# Patient Record
Sex: Female | Born: 1985 | Race: White | Hispanic: No | Smoking: Never smoker
Health system: Southern US, Community
[De-identification: ages and names within clinical notes are randomized; demographics above are authoritative.]

## PROBLEM LIST (undated history)

## (undated) DIAGNOSIS — F419 Anxiety disorder, unspecified: Secondary | ICD-10-CM

## (undated) DIAGNOSIS — F410 Panic disorder [episodic paroxysmal anxiety] without agoraphobia: Secondary | ICD-10-CM

---

## 2005-02-15 ENCOUNTER — Emergency Department: Payer: Self-pay | Admitting: Emergency Medicine

## 2005-06-15 ENCOUNTER — Emergency Department: Payer: Self-pay | Admitting: Emergency Medicine

## 2006-03-02 ENCOUNTER — Emergency Department: Payer: Self-pay | Admitting: Unknown Physician Specialty

## 2007-03-31 ENCOUNTER — Encounter: Payer: Self-pay | Admitting: Maternal & Fetal Medicine

## 2007-05-06 ENCOUNTER — Observation Stay: Payer: Self-pay | Admitting: Unknown Physician Specialty

## 2007-05-08 ENCOUNTER — Observation Stay: Payer: Self-pay

## 2007-06-03 ENCOUNTER — Observation Stay: Payer: Self-pay | Admitting: Obstetrics & Gynecology

## 2007-06-13 ENCOUNTER — Inpatient Hospital Stay: Payer: Self-pay

## 2007-08-22 ENCOUNTER — Inpatient Hospital Stay: Payer: Self-pay

## 2008-04-07 ENCOUNTER — Emergency Department: Payer: Self-pay | Admitting: Internal Medicine

## 2008-07-25 ENCOUNTER — Emergency Department: Payer: Self-pay | Admitting: Internal Medicine

## 2011-01-12 DIAGNOSIS — E785 Hyperlipidemia, unspecified: Secondary | ICD-10-CM | POA: Insufficient documentation

## 2011-06-22 ENCOUNTER — Ambulatory Visit: Payer: Self-pay | Admitting: Family Medicine

## 2011-07-16 ENCOUNTER — Ambulatory Visit: Payer: Self-pay | Admitting: Urology

## 2011-07-16 LAB — PREGNANCY, URINE: Pregnancy Test, Urine: NEGATIVE m[IU]/mL

## 2012-05-25 ENCOUNTER — Ambulatory Visit: Payer: Self-pay | Admitting: Obstetrics & Gynecology

## 2012-05-27 DIAGNOSIS — Z331 Pregnant state, incidental: Secondary | ICD-10-CM | POA: Insufficient documentation

## 2012-05-27 DIAGNOSIS — R109 Unspecified abdominal pain: Secondary | ICD-10-CM | POA: Insufficient documentation

## 2012-05-27 DIAGNOSIS — N390 Urinary tract infection, site not specified: Secondary | ICD-10-CM | POA: Insufficient documentation

## 2012-05-27 DIAGNOSIS — N200 Calculus of kidney: Secondary | ICD-10-CM | POA: Insufficient documentation

## 2012-11-05 ENCOUNTER — Inpatient Hospital Stay: Payer: Self-pay | Admitting: Obstetrics and Gynecology

## 2012-11-05 LAB — CBC WITH DIFFERENTIAL/PLATELET
Basophil #: 0 10*3/uL (ref 0.0–0.1)
Basophil %: 0.4 %
Eosinophil #: 0.1 10*3/uL (ref 0.0–0.7)
HCT: 31.4 % — ABNORMAL LOW (ref 35.0–47.0)
HGB: 10.5 g/dL — ABNORMAL LOW (ref 12.0–16.0)
Lymphocyte %: 15.9 %
MCHC: 33.4 g/dL (ref 32.0–36.0)
MCV: 78 fL — ABNORMAL LOW (ref 80–100)
Monocyte #: 0.7 x10 3/mm (ref 0.2–0.9)
Neutrophil #: 7.8 10*3/uL — ABNORMAL HIGH (ref 1.4–6.5)
Neutrophil %: 75.7 %
Platelet: 134 10*3/uL — ABNORMAL LOW (ref 150–440)
RBC: 4.05 10*6/uL (ref 3.80–5.20)

## 2013-03-30 ENCOUNTER — Ambulatory Visit: Payer: Self-pay | Admitting: Urology

## 2013-04-19 ENCOUNTER — Ambulatory Visit: Payer: Self-pay | Admitting: Urology

## 2013-04-19 LAB — HCG, QUANTITATIVE, PREGNANCY
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
## 2015-01-04 ENCOUNTER — Emergency Department: Payer: No Typology Code available for payment source

## 2015-01-04 ENCOUNTER — Emergency Department
Admission: EM | Admit: 2015-01-04 | Discharge: 2015-01-04 | Disposition: A | Discharge status: Home / Self Care | Payer: No Typology Code available for payment source | Attending: Emergency Medicine | Admitting: Emergency Medicine

## 2015-01-04 ENCOUNTER — Encounter: Payer: Self-pay | Admitting: Emergency Medicine

## 2015-01-04 DIAGNOSIS — Y998 Other external cause status: Secondary | ICD-10-CM | POA: Insufficient documentation

## 2015-01-04 DIAGNOSIS — G44319 Acute post-traumatic headache, not intractable: Secondary | ICD-10-CM

## 2015-01-04 DIAGNOSIS — Z791 Long term (current) use of non-steroidal anti-inflammatories (NSAID): Secondary | ICD-10-CM | POA: Insufficient documentation

## 2015-01-04 DIAGNOSIS — S20212A Contusion of left front wall of thorax, initial encounter: Secondary | ICD-10-CM | POA: Insufficient documentation

## 2015-01-04 DIAGNOSIS — Y92410 Unspecified street and highway as the place of occurrence of the external cause: Secondary | ICD-10-CM | POA: Diagnosis not present

## 2015-01-04 DIAGNOSIS — Y9389 Activity, other specified: Secondary | ICD-10-CM | POA: Insufficient documentation

## 2015-01-04 DIAGNOSIS — M62838 Other muscle spasm: Secondary | ICD-10-CM | POA: Diagnosis not present

## 2015-01-04 DIAGNOSIS — S0990XA Unspecified injury of head, initial encounter: Secondary | ICD-10-CM | POA: Diagnosis present

## 2015-01-04 DIAGNOSIS — S0003XA Contusion of scalp, initial encounter: Secondary | ICD-10-CM | POA: Diagnosis not present

## 2015-01-04 HISTORY — DX: Anxiety disorder, unspecified: F41.9

## 2015-01-04 HISTORY — DX: Panic disorder (episodic paroxysmal anxiety): F41.0

## 2015-01-04 MED ORDER — HYDROCODONE-ACETAMINOPHEN 5-325 MG PO TABS: 1.0000 | ORAL_TABLET | ORAL | Status: DC | PRN

## 2015-01-04 MED ORDER — MELOXICAM 15 MG PO TABS: 15.0000 mg | ORAL_TABLET | Freq: Every day | ORAL | Status: DC

## 2015-01-04 MED ORDER — DIAZEPAM 2 MG PO TABS: 2.0000 mg | ORAL_TABLET | Freq: Three times a day (TID) | ORAL | Status: DC | PRN

## 2015-01-04 MED ORDER — HYDROCODONE-ACETAMINOPHEN 5-325 MG PO TABS
1.0000 | ORAL_TABLET | Freq: Once | ORAL | Status: AC
  Administered 2015-01-04: 1 via ORAL
  Filled 2015-01-04: qty 1
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
## 2016-12-09 ENCOUNTER — Other Ambulatory Visit: Payer: Self-pay | Admitting: Family Medicine

## 2016-12-09 DIAGNOSIS — N6310 Unspecified lump in the right breast, unspecified quadrant: Secondary | ICD-10-CM

## 2016-12-21 ENCOUNTER — Ambulatory Visit
Admission: RE | Admit: 2016-12-21 | Discharge: 2016-12-21 | Disposition: A | Discharge status: Home / Self Care | Payer: BLUE CROSS/BLUE SHIELD | Source: Ambulatory Visit | Attending: Family Medicine | Admitting: Family Medicine

## 2016-12-21 DIAGNOSIS — N6311 Unspecified lump in the right breast, upper outer quadrant: Secondary | ICD-10-CM | POA: Insufficient documentation

## 2016-12-21 DIAGNOSIS — N6310 Unspecified lump in the right breast, unspecified quadrant: Secondary | ICD-10-CM
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
## 2021-05-12 ENCOUNTER — Encounter: Payer: Self-pay | Admitting: *Deleted

## 2022-05-07 DIAGNOSIS — F331 Major depressive disorder, recurrent, moderate: Secondary | ICD-10-CM | POA: Diagnosis not present

## 2022-05-07 DIAGNOSIS — F4321 Adjustment disorder with depressed mood: Secondary | ICD-10-CM | POA: Diagnosis not present

## 2022-05-07 DIAGNOSIS — Z1330 Encounter for screening examination for mental health and behavioral disorders, unspecified: Secondary | ICD-10-CM | POA: Diagnosis not present

## 2022-05-28 DIAGNOSIS — Z1330 Encounter for screening examination for mental health and behavioral disorders, unspecified: Secondary | ICD-10-CM | POA: Diagnosis not present

## 2022-05-28 DIAGNOSIS — F331 Major depressive disorder, recurrent, moderate: Secondary | ICD-10-CM | POA: Diagnosis not present

## 2022-05-28 DIAGNOSIS — F4321 Adjustment disorder with depressed mood: Secondary | ICD-10-CM | POA: Diagnosis not present

## 2022-05-28 DIAGNOSIS — Z1331 Encounter for screening for depression: Secondary | ICD-10-CM | POA: Diagnosis not present

## 2022-05-28 DIAGNOSIS — R21 Rash and other nonspecific skin eruption: Secondary | ICD-10-CM | POA: Diagnosis not present

## 2022-06-02 DIAGNOSIS — F4321 Adjustment disorder with depressed mood: Secondary | ICD-10-CM | POA: Diagnosis not present

## 2022-06-02 DIAGNOSIS — F4311 Post-traumatic stress disorder, acute: Secondary | ICD-10-CM | POA: Diagnosis not present

## 2023-03-30 ENCOUNTER — Ambulatory Visit (INDEPENDENT_AMBULATORY_CARE_PROVIDER_SITE_OTHER)

## 2023-03-30 ENCOUNTER — Ambulatory Visit
Admission: EM | Admit: 2023-03-30 | Discharge: 2023-03-30 | Disposition: A | Discharge status: Home / Self Care | Attending: Emergency Medicine | Admitting: Emergency Medicine

## 2023-03-30 DIAGNOSIS — F419 Anxiety disorder, unspecified: Secondary | ICD-10-CM | POA: Insufficient documentation

## 2023-03-30 DIAGNOSIS — M546 Pain in thoracic spine: Secondary | ICD-10-CM

## 2023-03-30 DIAGNOSIS — M545 Low back pain, unspecified: Secondary | ICD-10-CM

## 2023-03-30 DIAGNOSIS — S39012A Strain of muscle, fascia and tendon of lower back, initial encounter: Secondary | ICD-10-CM

## 2023-03-30 DIAGNOSIS — F32A Depression, unspecified: Secondary | ICD-10-CM | POA: Insufficient documentation

## 2023-03-30 MED ORDER — NAPROXEN 500 MG PO TABS: 500.0000 mg | ORAL_TABLET | Freq: Two times a day (BID) | ORAL | 0 refills | Status: AC

## 2023-03-30 MED ORDER — LIDOCAINE 5 % EX PTCH: 1.0000 | MEDICATED_PATCH | CUTANEOUS | 0 refills | Status: AC

## 2023-03-30 MED ORDER — METHYLPREDNISOLONE 4 MG PO TBPK: ORAL_TABLET | Freq: Every day | ORAL | 0 refills | Status: AC

## 2023-03-30 MED ORDER — HYDROCODONE-ACETAMINOPHEN 5-325 MG PO TABS: 1.0000 | ORAL_TABLET | Freq: Four times a day (QID) | ORAL | 0 refills | Status: AC | PRN

## 2023-03-30 MED ORDER — DEXAMETHASONE SODIUM PHOSPHATE 10 MG/ML IJ SOLN
10.0000 mg | Freq: Once | INTRAMUSCULAR | Status: AC
  Administered 2023-03-30: 10 mg via INTRAMUSCULAR

## 2023-03-30 MED ORDER — TIZANIDINE HCL 4 MG PO TABS: 4.0000 mg | ORAL_TABLET | Freq: Three times a day (TID) | ORAL | 0 refills | Status: AC | PRN

## 2023-03-30 NOTE — ED Triage Notes (Signed)
 Pt c/o back pain x2 days. States she was picking up elderly dog & as she was standing up she heard a pop. Has tried husbands left over oxy,flexeril & lidocaine patches w/o relief.

## 2023-03-30 NOTE — Discharge Instructions (Signed)
 You have some disc space narrowing in between TTE 12 and L1, which is primarily where your pain is.  This appears to be chronic per radiology.  The rest of your lumbar films appear normal to me.  We will contact you if the formal radiology over read was enough from mine and we need to change management.  Take the Naprosyn with a Tylenol containing product twice a day.  Either take the Naprosyn with 1000 mg of Tylenol or the Naprosyn with 1-2 Norco for severe pain.  You may take an additional 1000 mg of Tylenol or 1-2 Norco 1 more time a day.  Do not take the Klonopin if you are taking the Norco.  This can cause respiratory depression and death.  Zanaflex is an excellent muscle relaxant, and the Medrol Dosepak should help significantly.  Please follow-up with [facility] ASAP.  Go to the ER for numbness between your thighs, if you accidentally urinate or defecate on yourself, urinary retention, fevers above 100.4, or for other concerns

## 2023-03-30 NOTE — ED Provider Notes (Signed)
 HPI  SUBJECTIVE:  Diamond Ortiz is a 38 y.o. female who presents with the acute onset of severe Lower thoracic, upper lumbar pain after lifting a 60 pound dog 2 days ago.  States that she felt a pop, followed by spasm and burning pain.  She states that the pain is sharp, burning, constant, worse with movement.  She states that radiates down to her hips, it does not go into her legs or around into her chest.  No direct trauma to the area, numbness/tingling/weakness in her legs, saddle anesthesia, urinary fecal incontinence, urinary retention, fevers.  She has been using heating pad, her late husband's leftover OxyContin, lidocaine patches, Flexeril and has been taking ibuprofen 800 mg combined with 1000 mg of Tylenol every 4-6 hours with partial improvement in her symptoms.  Symptoms are worse with any movement, standing upright, turning over in bed.  She denies urinary symptoms.  No previous history of back injury, osteoporosis, diabetes, chronic kidney disease. She has a past medical history of recurrent E. coli UTIs, nephrolithiasis, hyperlipidemia.  LMP: She has an IUD.  Denies the possibility of being pregnant.  PCP: [facility].  Past Medical History:  Diagnosis Date   Anxiety    Panic attack depression    History reviewed. No pertinent surgical history.  Family History  Problem Relation Age of Onset   Breast cancer Paternal Aunt     Social History   Tobacco Use   Smoking status: Never  Substance Use Topics   Alcohol use: No    No current facility-administered medications for this encounter.  Current Outpatient Medications:    buPROPion (WELLBUTRIN XL) 300 MG 24 hr tablet, Take 300 mg by mouth daily., Disp: , Rfl:    clonazePAM (KLONOPIN) 1 MG tablet, Take 0.5-1 tablets (0.5-1 mg total) by mouth 2 (two) times daily as needed for Anxiety for up to 90 days, Disp: , Rfl:    desvenlafaxine (PRISTIQ) 100 MG 24 hr tablet, Take 100 mg by mouth daily., Disp: , Rfl:     HYDROcodone-acetaminophen (NORCO/VICODIN) 5-325 MG tablet, Take 1-2 tablets by mouth every 6 (six) hours as needed for moderate pain (pain score 4-6) or severe pain (pain score 7-10)., Disp: 12 tablet, Rfl: 0   levonorgestrel (LILETTA) 20.1 MCG/DAY IUD IUD, Insert 1 each into the uterus once, Disp: , Rfl:    lidocaine (LIDODERM) 5 %, Place 1 patch onto the skin daily. Remove & Discard patch within 12 hours or as directed by MD, Disp: 6 patch, Rfl: 0   methenamine (HIPREX) 1 g tablet, Take 1 g by mouth 2 (two) times daily., Disp: , Rfl:    methylPREDNISolone (MEDROL DOSEPAK) 4 MG TBPK tablet, Take by mouth daily. Follow package instructions, Disp: 21 tablet, Rfl: 0   naproxen (NAPROSYN) 500 MG tablet, Take 1 tablet (500 mg total) by mouth 2 (two) times daily., Disp: 20 tablet, Rfl: 0   ondansetron (ZOFRAN-ODT) 4 MG disintegrating tablet, Take 1 tablet (4 mg total) by mouth every 8 (eight) hours as needed for Nausea, Disp: , Rfl:    tiZANidine (ZANAFLEX) 4 MG tablet, Take 1 tablet (4 mg total) by mouth every 8 (eight) hours as needed for muscle spasms., Disp: 30 tablet, Rfl: 0   traZODone (DESYREL) 50 MG tablet, Take 1 tablet by mouth at bedtime., Disp: , Rfl:   Allergies  Allergen Reactions   Butorphanol Anxiety and Other (See Comments)     ROS  As noted in HPI.   Physical Exam  BP 132/87 (BP Location: Left Arm)   Pulse 86   Temp 98.9 F (37.2 C) (Oral)   Resp 16   Ht 5\' 4"  (1.626 m)   Wt 79.4 kg   SpO2 98%   BMI 30.04 kg/m   Constitutional: Well developed, well nourished, appears very uncomfortable.  Crying.  Pain clearly aggravated with movement. Eyes:  EOMI, conjunctiva normal bilaterally HENT: Normocephalic, atraumatic,mucus membranes moist Respiratory: Normal inspiratory effort, lungs clear bilaterally Cardiovascular: Normal rate GI: nondistended. No suprapubic tenderness skin: No rash, skin intact Musculoskeletal: Positive midline bony tenderness lower T-spine, upper  L-spine.  No CVAT.  Positive lower parathoracic tenderness, spasm.  No paralumbar tenderness. No L-spine, SI joint, sacral bony tenderness. Bilateral lower extremities nontender, baseline ROM with intact PT pulses.  Sensation between thighs intact. No pain with passive int/ext rotation, flex/extension hips, AD/ABduction bilaterally. SLR neg bilaterally. Sensation baseline light touch bilaterally for Pt, DTR's symmetric and intact bilaterally KJ, Motor symmetric bilateral 5/5 hip flexion, quadriceps, hamstrings, EHL, foot dorsiflexion, foot plantarflexion, gait antalgic but without apparent new ataxia. Neurologic: Alert & oriented x 3, no focal neuro deficits Psychiatric: Speech and behavior appropriate   ED Course   Medications  dexamethasone (DECADRON) injection 10 mg (10 mg Intramuscular Given 03/30/23 1535)    Orders Placed This Encounter  Procedures   DG Lumbar Spine Complete    Standing Status:   Standing    Number of Occurrences:   1    Reason for Exam (SYMPTOM  OR DIAGNOSIS REQUIRED):   Lower thoracic/upper lumbar midline tenderness after heavy lifting.  Rule out compression fracture, any acute changes.    Is patient pregnant?:   No   DG Thoracic Spine 2 View    Standing Status:   Standing    Number of Occurrences:   1    Reason for Exam (SYMPTOM  OR DIAGNOSIS REQUIRED):   Lower thoracic/upper lumbar midline tenderness after heavy lifting.  Rule out compression fracture, any acute changes.    Is patient pregnant?:   No    No results found for this or any previous visit (from the past 24 hours). DG Lumbar Spine Complete Result Date: 03/30/2023 CLINICAL DATA:  Injury back pain EXAM: LUMBAR SPINE - COMPLETE 4+ VIEW COMPARISON:  None Available. FINDINGS: Lumbar alignment is normal. Vertebral body heights are maintained. Mild degenerative change at T12-L1. IUD in the pelvis IMPRESSION: Mild degenerative change at T12-L1.  No acute osseous abnormality Electronically Signed   By: Jasmine Pang M.D.   On: 03/30/2023 17:20   DG Thoracic Spine 2 View Result Date: 03/30/2023 CLINICAL DATA:  Back pain EXAM: THORACIC SPINE 2 VIEWS COMPARISON:  None Available. FINDINGS: Minimal dextrocurvature. Vertebral body heights are normal. Mild disc space narrowing and degenerative change at T12-L1. Remaining disc spaces appear patent IMPRESSION: No acute osseous abnormality Electronically Signed   By: Jasmine Pang M.D.   On: 03/30/2023 17:19    ED Clinical Impression  1. Acute midline thoracic back pain   2. Lumbar pain   3. Back strain, initial encounter     ED Assessment/Plan      Patient has bony midline tenderness lower thoracic, upper lumbar spine, will image both.  She took 800 mg of ibuprofen combined with 1000 mg of Tylenol about 5 hours ago, discussed with her that I do not have anything else available to give her other than some steroids.  Will try dexamethasone 10 mg IM.  Doubt bilateral obstructing nephrolithiasis, deferring UA.  Reviewed  imaging independently and discussed with radiology.  Disc space narrowing at T12-L1, with some osteophytes and some sclerotic changes, this appears to be chronic looking per radiology.  There does not appear to be any other obvious acute injury.  Formal radiology report pending.  [facility] narcotic database reviewed.  Patient\ has 1 opiate prescription in September 2024.  She has recent benzodiazepine prescriptions from a single provider.  Radiology report reviewed.  This space narrowing degenerative changes at T12/L1.  No other acute changes.  See report for details.  Sending home with Naprosyn/Tylenol since the ibuprofen and Tylenol are not working, Medrol Dosepak, lidocaine patches, Zanaflex, short course of Norco for severe pain, and have her follow-up with [facility] ASAP.  ER return precautions given.  Advised her to not take the benzodiazepine if taking Norco.  Work note.   Discussed  imaging, MDM, treatment plan, and plan  for follow-up with patient. Discussed sn/sx that should prompt return to the ED. patient agrees with plan.   Meds ordered this encounter  Medications   dexamethasone (DECADRON) injection 10 mg   naproxen (NAPROSYN) 500 MG tablet    Sig: Take 1 tablet (500 mg total) by mouth 2 (two) times daily.    Dispense:  20 tablet    Refill:  0   methylPREDNISolone (MEDROL DOSEPAK) 4 MG TBPK tablet    Sig: Take by mouth daily. Follow package instructions    Dispense:  21 tablet    Refill:  0   lidocaine (LIDODERM) 5 %    Sig: Place 1 patch onto the skin daily. Remove & Discard patch within 12 hours or as directed by MD    Dispense:  6 patch    Refill:  0   tiZANidine (ZANAFLEX) 4 MG tablet    Sig: Take 1 tablet (4 mg total) by mouth every 8 (eight) hours as needed for muscle spasms.    Dispense:  30 tablet    Refill:  0   HYDROcodone-acetaminophen (NORCO/VICODIN) 5-325 MG tablet    Sig: Take 1-2 tablets by mouth every 6 (six) hours as needed for moderate pain (pain score 4-6) or severe pain (pain score 7-10).    Dispense:  12 tablet    Refill:  0    *This clinic note was created using Scientist, clinical (histocompatibility and immunogenetics). Therefore, there may be occasional mistakes despite careful proofreading.  ?     Domenick Gong, MD 03/30/23 1724

## 2023-10-15 ENCOUNTER — Other Ambulatory Visit: Payer: Self-pay | Admitting: Cardiology

## 2023-10-15 DIAGNOSIS — E7800 Pure hypercholesterolemia, unspecified: Secondary | ICD-10-CM

## 2023-10-15 DIAGNOSIS — E66811 Obesity, class 1: Secondary | ICD-10-CM

## 2023-10-15 DIAGNOSIS — R0602 Shortness of breath: Secondary | ICD-10-CM

## 2023-10-15 DIAGNOSIS — R0609 Other forms of dyspnea: Secondary | ICD-10-CM

## 2023-10-15 DIAGNOSIS — Z8249 Family history of ischemic heart disease and other diseases of the circulatory system: Secondary | ICD-10-CM

## 2023-10-15 DIAGNOSIS — R0789 Other chest pain: Secondary | ICD-10-CM

## 2023-11-03 ENCOUNTER — Telehealth (HOSPITAL_COMMUNITY): Payer: Self-pay | Admitting: Emergency Medicine

## 2023-11-03 NOTE — Telephone Encounter (Signed)
Unable to leave vm. Vm box full Marchia Bond RN Navigator Cardiac Imaging [facility] [phone] Office  [phone] Cell

## 2023-11-03 NOTE — Telephone Encounter (Signed)
 Reaching out to patient to offer assistance regarding upcoming cardiac imaging study; pt verbalizes understanding of appt date/time, parking situation and where to check in, pre-test NPO status and medications ordered, and verified current allergies; name and call back number provided for further questions should they arise Rockwell Alexandria RN Navigator Cardiac Imaging [facility] [phone] office [phone] cell

## 2023-11-04 ENCOUNTER — Ambulatory Visit
Admission: RE | Admit: 2023-11-04 | Discharge: 2023-11-04 | Disposition: A | Discharge status: Home / Self Care | Source: Ambulatory Visit | Attending: Cardiology | Admitting: Cardiology

## 2023-11-04 DIAGNOSIS — E7800 Pure hypercholesterolemia, unspecified: Secondary | ICD-10-CM | POA: Insufficient documentation

## 2023-11-04 DIAGNOSIS — E66811 Obesity, class 1: Secondary | ICD-10-CM | POA: Insufficient documentation

## 2023-11-04 DIAGNOSIS — R0609 Other forms of dyspnea: Secondary | ICD-10-CM | POA: Diagnosis present

## 2023-11-04 DIAGNOSIS — R0789 Other chest pain: Secondary | ICD-10-CM | POA: Diagnosis present

## 2023-11-04 DIAGNOSIS — R0602 Shortness of breath: Secondary | ICD-10-CM | POA: Insufficient documentation

## 2023-11-04 DIAGNOSIS — Z8249 Family history of ischemic heart disease and other diseases of the circulatory system: Secondary | ICD-10-CM | POA: Insufficient documentation

## 2023-11-04 MED ORDER — METOPROLOL TARTRATE 5 MG/5ML IV SOLN: 10.0000 mg | Freq: Once | INTRAVENOUS | Status: DC | PRN

## 2023-11-04 MED ORDER — IOHEXOL 350 MG/ML SOLN
100.0000 mL | Freq: Once | INTRAVENOUS | Status: AC | PRN
  Administered 2023-11-04: 100 mL via INTRAVENOUS

## 2023-11-04 MED ORDER — DILTIAZEM HCL 25 MG/5ML IV SOLN: 10.0000 mg | INTRAVENOUS | Status: DC | PRN

## 2023-11-04 MED ORDER — NITROGLYCERIN 0.4 MG SL SUBL
0.8000 mg | SUBLINGUAL_TABLET | Freq: Once | SUBLINGUAL | Status: AC
  Administered 2023-11-04: 0.8 mg via SUBLINGUAL
  Filled 2023-11-04: qty 25

## 2023-11-04 NOTE — Progress Notes (Signed)
 Pt tolerated cardiac CT with nitroglycerin and contrast. Pt stated feeling a warm, rushing sensation from IV contrast. No HA at conclusion of scan and able to ambulate independently back to waiting room. VS WDL. All questions answered by this RN, understanding verbalized.
